# Patient Record
Sex: Female | Born: 1950
Health system: Southern US, Community
[De-identification: ages and names within clinical notes are randomized; demographics above are authoritative.]

## PROBLEM LIST (undated history)

## (undated) DIAGNOSIS — I10 Essential (primary) hypertension: Secondary | ICD-10-CM

## (undated) DIAGNOSIS — E7800 Pure hypercholesterolemia, unspecified: Secondary | ICD-10-CM

---

## 2014-12-06 ENCOUNTER — Emergency Department (HOSPITAL_COMMUNITY)
Admission: EM | Admit: 2014-12-06 | Discharge: 2014-12-07 | Disposition: A | Discharge status: Home / Self Care | Payer: Medicaid - Out of State | Attending: Emergency Medicine | Admitting: Emergency Medicine

## 2014-12-06 ENCOUNTER — Emergency Department (HOSPITAL_COMMUNITY): Payer: Medicaid - Out of State

## 2014-12-06 ENCOUNTER — Encounter (HOSPITAL_COMMUNITY): Payer: Self-pay | Admitting: Emergency Medicine

## 2014-12-06 DIAGNOSIS — Z7982 Long term (current) use of aspirin: Secondary | ICD-10-CM | POA: Diagnosis not present

## 2014-12-06 DIAGNOSIS — R110 Nausea: Secondary | ICD-10-CM | POA: Insufficient documentation

## 2014-12-06 DIAGNOSIS — R51 Headache: Secondary | ICD-10-CM | POA: Insufficient documentation

## 2014-12-06 DIAGNOSIS — Z8639 Personal history of other endocrine, nutritional and metabolic disease: Secondary | ICD-10-CM | POA: Insufficient documentation

## 2014-12-06 DIAGNOSIS — Z79899 Other long term (current) drug therapy: Secondary | ICD-10-CM | POA: Insufficient documentation

## 2014-12-06 DIAGNOSIS — R519 Headache, unspecified: Secondary | ICD-10-CM

## 2014-12-06 DIAGNOSIS — I10 Essential (primary) hypertension: Secondary | ICD-10-CM | POA: Insufficient documentation

## 2014-12-06 HISTORY — DX: Essential (primary) hypertension: I10

## 2014-12-06 HISTORY — DX: Pure hypercholesterolemia, unspecified: E78.00

## 2014-12-06 NOTE — ED Notes (Signed)
Pt arrived to the ED with a complaint of hypertension.  Pt has a hx of same.  Pt states she began having a headache around 1900 hrs.  Pt states that she took her IR hypertension medication around 1800 and the home pressure decreased but was still elevated.  Pt states she retook the medication prior to arrival.  Pt also took aspirin prior to arrival.  Pt is complaining of a headache in the bilateral temples.

## 2014-12-06 NOTE — ED Provider Notes (Signed)
CSN: [id number]     Arrival date & time 12/06/14  2248 History   First MD Initiated Contact with Patient 12/06/14 2320     Chief Complaint  Patient presents with  . Hypertension     (Consider location/radiation/quality/duration/timing/severity/associated sxs/prior Treatment) The history is provided by the patient and medical records. No language interpreter was used.    Tammy Morris is a 64 y.o. female  with a hx of HTN, high cholesterol presents to the Emergency Department complaining of gradual, persistent, progressively worsening temporal headache onset 3 pm today. Pt reports that at 3pm today she had an episode of pallor and gradual onset headache with associated nausea.  Pt's BP when she arrived home was 200/100 (taken by an EMT).  No CP, jaw pain, numbness, weakness, dizziness or vision changes.  Pt took 2 Nifedipine 10mg  tabs SL at 6pm Rx by her doctor.  Pt's BP decreased to 180/96 with a headache at 7/10 at that time.  Headache was not thunderclap, worst of her life or sudden onset.  Pt without syncope or vomiting.  At 11PM she took a third tablet of Nifedipine.  No other symptoms.  No hearing or alleviating factors. No photophobia or phonophobia. Patient reports headache similar one other time previously when she food poisoning.  She denies fever, chills, pain, neck stiffness, chest pain, shortness of breath, abdominal pain, vomiting, diarrhea, weakness, dizziness, syncope, dysuria, hematuria.  Past Medical History  Diagnosis Date  . Hypertension   . Hypercholesteremia    History reviewed. No pertinent past surgical history. History reviewed. No pertinent family history. History  Substance Use Topics  . Smoking status: Never Smoker   . Smokeless tobacco: Never Used  . Alcohol Use: No   OB History    No data available     Review of Systems  Constitutional: Negative for fever, diaphoresis, appetite change, fatigue and unexpected weight change.  HENT: Negative for mouth sores.    Eyes: Negative for visual disturbance.  Respiratory: Negative for cough, chest tightness, shortness of breath and wheezing.   Cardiovascular: Negative for chest pain.  Gastrointestinal: Positive for nausea. Negative for vomiting, abdominal pain, diarrhea and constipation.  Endocrine: Negative for polydipsia, polyphagia and polyuria.  Genitourinary: Negative for dysuria, urgency, frequency and hematuria.  Musculoskeletal: Negative for back pain and neck stiffness.  Skin: Negative for rash.  Allergic/Immunologic: Negative for immunocompromised state.  Neurological: Positive for headaches. Negative for syncope and light-headedness.  Hematological: Does not bruise/bleed easily.  Psychiatric/Behavioral: Negative for sleep disturbance. The patient is not nervous/anxious.       Allergies  Cashew nut oil  Home Medications   Prior to Admission medications   Medication Sig Start Date End Date Taking? Authorizing Provider  aspirin EC 325 MG tablet Take 325 mg by mouth daily.   Yes Historical Provider, MD  Multiple Vitamin (MULTIVITAMIN WITH MINERALS) TABS tablet Take 1 tablet by mouth daily.   Yes Historical Provider, MD  NIFEdipine (PROCARDIA) 10 MG capsule Take 10 mg by mouth 3 (three) times daily as needed (for blood pressure >160).   Yes Historical Provider, MD  olmesartan (BENICAR) 20 MG tablet Take 20 mg by mouth daily.   Yes Historical Provider, MD  triamterene-hydrochlorothiazide (DYAZIDE) 50-25 MG per capsule Take 1 capsule by mouth every morning.   Yes Historical Provider, MD   BP 132/78 mmHg  Pulse 57  Temp(Src) 97.7 F (36.5 C) (Oral)  Resp 14  Ht 5\' 1"  (1.549 m)  Wt 176 lb 5.9 oz (80  kg)  BMI 33.34 kg/m2  SpO2 98%  LMP  (Approximate) Physical Exam  Constitutional: She is oriented to person, place, and time. She appears well-developed and well-nourished. No distress.  HENT:  Head: Normocephalic and atraumatic.  Mouth/Throat: Oropharynx is clear and moist.  Eyes:  Conjunctivae and EOM are normal. Pupils are equal, round, and reactive to light. No scleral icterus.  No horizontal, vertical or rotational nystagmus  Neck: Normal range of motion. Neck supple.  Full active and passive ROM without pain No midline or paraspinal tenderness No nuchal rigidity or meningeal signs  Cardiovascular: Normal rate, regular rhythm, normal heart sounds and intact distal pulses.   No murmur heard. Pulmonary/Chest: Effort normal and breath sounds normal. No respiratory distress. She has no wheezes. She has no rales.  Abdominal: Soft. Bowel sounds are normal. She exhibits no distension. There is no tenderness. There is no rebound and no guarding.  Musculoskeletal: Normal range of motion.  Lymphadenopathy:    She has no cervical adenopathy.  Neurological: She is alert and oriented to person, place, and time. She has normal reflexes. No cranial nerve deficit. She exhibits normal muscle tone. Coordination normal.  Mental Status:  Alert, oriented, thought content appropriate. Speech fluent without evidence of aphasia. Able to follow 2 step commands without difficulty.  Cranial Nerves:  II:  Peripheral visual fields grossly normal, pupils equal, round, reactive to light III,IV, VI: ptosis not present, extra-ocular motions intact bilaterally  V,VII: smile symmetric, facial light touch sensation equal VIII: hearing grossly normal bilaterally  IX,X: gag reflex present  XI: bilateral shoulder shrug equal and strong XII: midline tongue extension  Motor:  5/5 in upper and lower extremities bilaterally including strong and equal grip strength and dorsiflexion/plantar flexion Sensory: Pinprick and light touch normal in all extremities.  Deep Tendon Reflexes: 2+ and symmetric  Cerebellar: normal finger-to-nose with bilateral upper extremities Gait: normal gait and balance CV: distal pulses palpable throughout   Skin: Skin is warm and dry. No rash noted. She is not diaphoretic. No  erythema.  Psychiatric: She has a normal mood and affect. Her behavior is normal. Judgment and thought content normal.  Nursing note and vitals reviewed.   ED Course  Procedures (including critical care time) Labs Review Labs Reviewed  CBC WITH DIFFERENTIAL - Abnormal; Notable for the following:    Neutrophils Relative % 41 (*)    All other components within normal limits  URINALYSIS, ROUTINE W REFLEX MICROSCOPIC  BASIC METABOLIC PANEL  TROPONIN I    Imaging Review Ct Head Wo Contrast  12/07/2014   CLINICAL DATA:  Diffuse headache, onset around 1700 hr tonight. Hypertension.  EXAM: CT HEAD WITHOUT CONTRAST  TECHNIQUE: Contiguous axial images were obtained from the base of the skull through the vertex without intravenous contrast.  COMPARISON:  None.  FINDINGS: Ventricles and sulci appear symmetrical. No mass effect or midline shift. No abnormal extra-axial fluid collections. Gray-white matter junctions are distinct. Basal cisterns are not effaced. No evidence of acute intracranial hemorrhage. No depressed skull fractures. Retention cyst in the right maxillary antrum. Paranasal sinuses are otherwise clear. Mastoid air cells are not opacified.  IMPRESSION: No acute intracranial abnormalities. Retention cyst in the right maxillary antrum.   Electronically Signed   By: Burman Nieves M.D.   On: 12/07/2014 00:26     EKG Interpretation None        Date: 12/07/2014  Rate: 57  Rhythm: sinus bradycardia  QRS Axis: normal  Intervals: normal  ST/T Wave abnormalities:  normal  Conduction Disutrbances:none  Narrative Interpretation: nonischemic ECG  Old EKG Reviewed: none available    MDM   Final diagnoses:  Headache  HTN (hypertension)   Tammy Morris presents with headache and HTN, treated at home by  Procardia sublingual. Patient also with headache. Normal neurologic exam. Head CT and labs will be ordered. Patient was given pain medicine for headache.  1:00 AM Patient remains  alert and oriented. Several episodes of bradycardia noted while in the room to low 50s - upper 40s.  No significant hypotension noted however patient's blood pressure does remain in the 120s to 130s systolic.    Pt discussed with Dr. Read Drivers who will follow pending troponin, BMP, HR and BPs.  Disposition accordingly after her re-evaluation.  Dahlia Client Juno Bozard, PA-C 12/07/14 0102  Hanley Seamen, MD 12/07/14 4782  Hanley Seamen, MD 12/07/14 [id number]

## 2014-12-07 LAB — URINALYSIS, ROUTINE W REFLEX MICROSCOPIC
Bilirubin Urine: NEGATIVE
GLUCOSE, UA: NEGATIVE mg/dL
HGB URINE DIPSTICK: NEGATIVE
Ketones, ur: NEGATIVE mg/dL
Leukocytes, UA: NEGATIVE
Nitrite: NEGATIVE
Protein, ur: NEGATIVE mg/dL
SPECIFIC GRAVITY, URINE: 1.013 (ref 1.005–1.030)
Urobilinogen, UA: 0.2 mg/dL (ref 0.0–1.0)
pH: 7.5 (ref 5.0–8.0)

## 2014-12-07 LAB — BASIC METABOLIC PANEL
Anion gap: 8 (ref 5–15)
BUN: 17 mg/dL (ref 6–23)
CHLORIDE: 103 meq/L (ref 96–112)
CO2: 27 mmol/L (ref 19–32)
Calcium: 9.9 mg/dL (ref 8.4–10.5)
Creatinine, Ser: 0.5 mg/dL (ref 0.50–1.10)
GFR calc Af Amer: >90 mL/min (ref >90)
Glucose, Bld: 100 mg/dL — ABNORMAL HIGH (ref 70–99)
POTASSIUM: 3.5 mmol/L (ref 3.5–5.1)
Sodium: 138 mmol/L (ref 135–145)

## 2014-12-07 LAB — CBC WITH DIFFERENTIAL/PLATELET
BASOS ABS: 0 10*3/uL (ref 0.0–0.1)
BASOS PCT: 1 % (ref 0–1)
EOS ABS: 0.2 10*3/uL (ref 0.0–0.7)
Eosinophils Relative: 3 % (ref 0–5)
HCT: 39.5 % (ref 36.0–46.0)
Hemoglobin: 12.6 g/dL (ref 12.0–15.0)
Lymphocytes Relative: 46 % (ref 12–46)
Lymphs Abs: 3.1 10*3/uL (ref 0.7–4.0)
MCH: 28 pg (ref 26.0–34.0)
MCHC: 31.9 g/dL (ref 30.0–36.0)
MCV: 87.8 fL (ref 78.0–100.0)
MONOS PCT: 9 % (ref 3–12)
Monocytes Absolute: 0.6 10*3/uL (ref 0.1–1.0)
NEUTROS ABS: 2.7 10*3/uL (ref 1.7–7.7)
Neutrophils Relative %: 41 % — ABNORMAL LOW (ref 43–77)
Platelets: 292 10*3/uL (ref 150–400)
RBC: 4.5 MIL/uL (ref 3.87–5.11)
RDW: 13.9 % (ref 11.5–15.5)
WBC: 6.6 10*3/uL (ref 4.0–10.5)

## 2014-12-07 LAB — TROPONIN I: Troponin I: <0.03 ng/mL (ref <0.031)

## 2014-12-07 MED ORDER — NIFEDIPINE 10 MG PO CAPS: 10.0000 mg | ORAL_CAPSULE | Freq: Three times a day (TID) | ORAL | Status: DC | PRN

## 2014-12-07 MED ORDER — HYDROCODONE-ACETAMINOPHEN 5-325 MG PO TABS
2.0000 | ORAL_TABLET | Freq: Once | ORAL | Status: AC
  Administered 2014-12-07: 2 via ORAL
  Filled 2014-12-07: qty 2

## 2017-01-30 ENCOUNTER — Emergency Department (HOSPITAL_COMMUNITY): Payer: Medicaid - Out of State

## 2017-01-30 ENCOUNTER — Encounter (HOSPITAL_COMMUNITY): Payer: Self-pay | Admitting: Emergency Medicine

## 2017-01-30 ENCOUNTER — Emergency Department (HOSPITAL_COMMUNITY)
Admission: EM | Admit: 2017-01-30 | Discharge: 2017-01-30 | Disposition: A | Discharge status: Home / Self Care | Payer: Medicaid - Out of State | Attending: Emergency Medicine | Admitting: Emergency Medicine

## 2017-01-30 DIAGNOSIS — Y929 Unspecified place or not applicable: Secondary | ICD-10-CM | POA: Insufficient documentation

## 2017-01-30 DIAGNOSIS — W19XXXA Unspecified fall, initial encounter: Secondary | ICD-10-CM

## 2017-01-30 DIAGNOSIS — S0083XA Contusion of other part of head, initial encounter: Secondary | ICD-10-CM | POA: Insufficient documentation

## 2017-01-30 DIAGNOSIS — Z7982 Long term (current) use of aspirin: Secondary | ICD-10-CM | POA: Insufficient documentation

## 2017-01-30 DIAGNOSIS — Y999 Unspecified external cause status: Secondary | ICD-10-CM | POA: Insufficient documentation

## 2017-01-30 DIAGNOSIS — S40811A Abrasion of right upper arm, initial encounter: Secondary | ICD-10-CM | POA: Insufficient documentation

## 2017-01-30 DIAGNOSIS — W01198A Fall on same level from slipping, tripping and stumbling with subsequent striking against other object, initial encounter: Secondary | ICD-10-CM | POA: Insufficient documentation

## 2017-01-30 DIAGNOSIS — I10 Essential (primary) hypertension: Secondary | ICD-10-CM | POA: Insufficient documentation

## 2017-01-30 DIAGNOSIS — Y939 Activity, unspecified: Secondary | ICD-10-CM | POA: Insufficient documentation

## 2017-01-30 DIAGNOSIS — M542 Cervicalgia: Secondary | ICD-10-CM | POA: Insufficient documentation

## 2017-01-30 MED ORDER — TETANUS-DIPHTH-ACELL PERTUSSIS 5-2.5-18.5 LF-MCG/0.5 IM SUSP: 0.5000 mL | Freq: Once | INTRAMUSCULAR | Status: DC

## 2017-01-30 MED ORDER — BACITRACIN ZINC 500 UNIT/GM EX OINT
TOPICAL_OINTMENT | Freq: Two times a day (BID) | CUTANEOUS | Status: DC
  Filled 2017-01-30: qty 0.9

## 2017-01-30 MED ORDER — ACETAMINOPHEN 500 MG PO TABS
1000.0000 mg | ORAL_TABLET | Freq: Once | ORAL | Status: AC
  Administered 2017-01-30: 1000 mg via ORAL
  Filled 2017-01-30: qty 2

## 2017-01-30 MED ORDER — ACETAMINOPHEN 325 MG PO TABS
650.0000 mg | ORAL_TABLET | Freq: Once | ORAL | Status: AC
  Administered 2017-01-30: 650 mg via ORAL
  Filled 2017-01-30: qty 2

## 2017-01-30 MED ORDER — BACITRACIN ZINC 500 UNIT/GM EX OINT
TOPICAL_OINTMENT | CUTANEOUS | Status: AC
  Administered 2017-01-30: 1
  Filled 2017-01-30: qty 0.9

## 2017-01-30 NOTE — Discharge Instructions (Signed)
Apply ice to painful swollen areas 4 times daily for 30 minutes at a time. Wash the wound on your nose daily with soap and water and place a thin layer of bacitracin ointment over the wound. Signs of infection include redness around the wound, more pain, drainage from the wound or fever. Return or see an urgent care center if you think he might be developing an infection.. Your blood pressure is elevated tonight at 161/78. It should be rechecked within the next one or 2 weeks. Take Tylenol as directed for pain

## 2017-01-30 NOTE — ED Provider Notes (Signed)
[facility]-EMERGENCY DEPT Provider Note   CSN: [id number] Arrival date & time: 01/30/17  1713  Patient speaks no English. She was offered a professional interpreter which she declined. Her adult child acted as interpreter   History   Education officer, museumChief Complaint Chief Complaint  Patient presents with  . Fall  . Facial Injury  . Hypertension    HPI Tammy Morris is a 66 y.o. female.HPI Patient tripped and fell on the sidewalk 5 PM today striking her face. No other injury. No loss consciousness no neck pain. She displays a facial pain and mild headache since the event. No other associated symptoms. No treatment prior to coming here. She's been Tammy GreenspanAmer Morris since event. She was feeling well prior to the event. Past Medical History:  Diagnosis Date  . Hypercholesteremia   . Hypertension     There are no active problems to display for this patient.   History reviewed. No pertinent surgical history.  OB History    No data available       Home Medications    Prior to Admission medications   Medication Sig Start Date End Date Taking? Authorizing Provider  Amlodipine Besylate-Valsartan (EXFORGE PO) Take 1 tablet by mouth daily.   Yes Historical Provider, MD  aspirin EC 325 MG tablet Take 325 mg by mouth daily.   Yes Historical Provider, MD  ATORVASTATIN CALCIUM PO Take 1 tablet by mouth every evening.   Yes Historical Provider, MD  NIFEdipine (PROCARDIA) 10 MG capsule Take 1 capsule (10 mg total) by mouth 3 (three) times daily as needed (for blood pressure >160). Patient not taking: Reported on 01/30/2017 12/07/14   Paula LibraJohn Molpus, MD  olmesartan (BENICAR) 20 MG tablet Take 20 mg by mouth daily.    Historical Provider, MD    Family History No family history on file.  Social History Social History  Substance Use Topics  . Smoking status: Never Smoker  . Smokeless tobacco: Never Used  . Alcohol use No     Allergies   Cashew nut oil   Review of Systems Review of Systems  Constitutional:  Negative.   HENT: Positive for facial swelling.        Facial pain  Respiratory: Negative.   Cardiovascular: Negative.   Gastrointestinal: Negative.   Musculoskeletal: Negative.   Skin: Negative.   Neurological: Negative.   Psychiatric/Behavioral: Negative.   All other systems reviewed and are negative.    Physical Exam Updated Vital Signs BP 161/78   Pulse 68   Temp 98.6 F (37 C) (Oral)   Resp 16   Ht 5\' 1"  (1.549 m)   Wt 176 lb (79.8 kg)   SpO2 99%   BMI 33.25 kg/m   Physical Exam  Constitutional: She is oriented to person, place, and time. She appears well-developed and well-nourished. No distress.  HENT:  Tip of nose is swollen and ecchymotic. No septal hematoma. There is an abrasion over the nose. Left sided infraorbital ecchymosis.  Eyes: Conjunctivae and EOM are normal. Pupils are equal, round, and reactive to light.  No diplopia  Neck: Neck supple. No tracheal deviation present. No thyromegaly present.  Cervical spine nontender.  Cardiovascular: Normal rate and regular rhythm.   No murmur heard. Pulmonary/Chest: Effort normal and breath sounds normal.  Abdominal: Soft. Bowel sounds are normal. She exhibits no distension. There is no tenderness.  Musculoskeletal: Normal range of motion. She exhibits no edema or tenderness.    right upper extremity 2 cm abrasion over right thenar eminence and full  range of motion. No bony tenderness. Good capillary refill. All extremities no contusion abrasion or tenderness neurovascular intact. Pelvis stable nontender. Entire spine nontender.   Neurological: She is alert and oriented to person, place, and time. No cranial nerve deficit. Coordination normal.  Gait normal motor strength 5 over 5 overall  Skin: Skin is warm and dry. No rash noted.  Psychiatric: She has a normal mood and affect.  Nursing note and vitals reviewed.    Results for orders placed or performed during the hospital encounter of 12/06/14  CBC with  Differential  Result Value Ref Range   WBC 6.6 4.0 - 10.5 K/uL   RBC 4.50 3.87 - 5.11 MIL/uL   Hemoglobin 12.6 12.0 - 15.0 g/dL   HCT 16.1 09.6 - 04.5 %   MCV 87.8 78.0 - 100.0 fL   MCH 28.0 26.0 - 34.0 pg   MCHC 31.9 30.0 - 36.0 g/dL   RDW 40.9 81.1 - 91.4 %   Platelets 292 150 - 400 K/uL   Neutrophils Relative % 41 (L) 43 - 77 %   Neutro Abs 2.7 1.7 - 7.7 K/uL   Lymphocytes Relative 46 12 - 46 %   Lymphs Abs 3.1 0.7 - 4.0 K/uL   Monocytes Relative 9 3 - 12 %   Monocytes Absolute 0.6 0.1 - 1.0 K/uL   Eosinophils Relative 3 0 - 5 %   Eosinophils Absolute 0.2 0.0 - 0.7 K/uL   Basophils Relative 1 0 - 1 %   Basophils Absolute 0.0 0.0 - 0.1 K/uL  Basic metabolic panel  Result Value Ref Range   Sodium 138 135 - 145 mmol/L   Potassium 3.5 3.5 - 5.1 mmol/L   Chloride 103 96 - 112 mEq/L   CO2 27 19 - 32 mmol/L   Glucose, Bld 100 (H) 70 - 99 mg/dL   BUN 17 6 - 23 mg/dL   Creatinine, Ser 7.82 0.50 - 1.10 mg/dL   Calcium 9.9 8.4 - 95.6 mg/dL   GFR calc non Af Tammy >90 >90 mL/min   GFR calc Af Tammy >90 >90 mL/min   Anion gap 8 5 - 15  Troponin I  Result Value Ref Range   Troponin I <0.03 <0.031 ng/mL  Urinalysis, Routine w reflex microscopic  Result Value Ref Range   Color, Urine YELLOW YELLOW   APPearance CLEAR CLEAR   Specific Gravity, Urine 1.013 1.005 - 1.030   pH 7.5 5.0 - 8.0   Glucose, UA NEGATIVE NEGATIVE mg/dL   Hgb urine dipstick NEGATIVE NEGATIVE   Bilirubin Urine NEGATIVE NEGATIVE   Ketones, ur NEGATIVE NEGATIVE mg/dL   Protein, ur NEGATIVE NEGATIVE mg/dL   Urobilinogen, UA 0.2 0.0 - 1.0 mg/dL   Nitrite NEGATIVE NEGATIVE   Leukocytes, UA NEGATIVE NEGATIVE   Ct Cervical Spine Wo Contrast  Result Date: 01/30/2017 CLINICAL DATA:  Trip and fall on sidewalk with facial and cervical neck pain. EXAM: CT MAXILLOFACIAL WITHOUT CONTRAST CT CERVICAL SPINE WITHOUT CONTRAST TECHNIQUE: Multidetector CT imaging of the maxillofacial structures was performed. Multiplanar CT image  reconstructions were also generated. A small metallic BB was placed on the right temple in order to reliably differentiate right from left. Multidetector CT imaging of the cervical spine was performed without intravenous contrast. Multiplanar CT image reconstructions were also generated. COMPARISON:  None. FINDINGS: CT MAXILLOFACIAL FINDINGS Osseous: No acute facial bone fracture. There is anterior nasal edema without evidence of nasal bone fracture. Nasal septum is midline. Mandibles are intact, temporomandibular joints remain  congruent. Zygomatic arches are intact. Orbits: Negative. No traumatic or inflammatory finding. Sinuses: Mucous retention cyst in the right maxillary sinus. Paranasal sinuses are otherwise clear. No fluid levels. Soft tissues: Nasal edema and probable hematoma anteriorly. The left frontal scalp edema. No radiopaque foreign body. Limited intracranial: No significant or unexpected finding. CT CERVICAL FINDINGS Alignment: Reversal of normal lordosis. Trace anterolisthesis of C7 on T1 appears degenerative. No acute traumatic subluxation, jumped or perched facets. Skull base and vertebrae: No acute fracture. No primary bone lesion or focal pathologic process. Scattered lucencies throughout the vertebra suggestive of osteopenia. Soft tissues and spinal canal: No prevertebral fluid or swelling. No visible canal hematoma. Disc levels: Disc space narrowing and endplate spurring is most prominent at C5-C6. Multifocal facet arthropathy, most prominent at C2-C3 on the right. Upper chest: No acute abnormality. Other: None. IMPRESSION: 1. Nasal swelling and probable hematoma. No evidence of nasal bone fracture. No acute facial bone fracture is seen. 2. Degenerative change in the cervical spine without acute fracture or subluxation. Electronically Signed   By: Rubye Oaks M.D.   On: 01/30/2017 19:18   Ct Maxillofacial Wo Contrast  Result Date: 01/30/2017 CLINICAL DATA:  Trip and fall on sidewalk  with facial and cervical neck pain. EXAM: CT MAXILLOFACIAL WITHOUT CONTRAST CT CERVICAL SPINE WITHOUT CONTRAST TECHNIQUE: Multidetector CT imaging of the maxillofacial structures was performed. Multiplanar CT image reconstructions were also generated. A small metallic BB was placed on the right temple in order to reliably differentiate right from left. Multidetector CT imaging of the cervical spine was performed without intravenous contrast. Multiplanar CT image reconstructions were also generated. COMPARISON:  None. FINDINGS: CT MAXILLOFACIAL FINDINGS Osseous: No acute facial bone fracture. There is anterior nasal edema without evidence of nasal bone fracture. Nasal septum is midline. Mandibles are intact, temporomandibular joints remain congruent. Zygomatic arches are intact. Orbits: Negative. No traumatic or inflammatory finding. Sinuses: Mucous retention cyst in the right maxillary sinus. Paranasal sinuses are otherwise clear. No fluid levels. Soft tissues: Nasal edema and probable hematoma anteriorly. The left frontal scalp edema. No radiopaque foreign body. Limited intracranial: No significant or unexpected finding. CT CERVICAL FINDINGS Alignment: Reversal of normal lordosis. Trace anterolisthesis of C7 on T1 appears degenerative. No acute traumatic subluxation, jumped or perched facets. Skull base and vertebrae: No acute fracture. No primary bone lesion or focal pathologic process. Scattered lucencies throughout the vertebra suggestive of osteopenia. Soft tissues and spinal canal: No prevertebral fluid or swelling. No visible canal hematoma. Disc levels: Disc space narrowing and endplate spurring is most prominent at C5-C6. Multifocal facet arthropathy, most prominent at C2-C3 on the right. Upper chest: No acute abnormality. Other: None. IMPRESSION: 1. Nasal swelling and probable hematoma. No evidence of nasal bone fracture. No acute facial bone fracture is seen. 2. Degenerative change in the cervical spine  without acute fracture or subluxation. Electronically Signed   By: Rubye Oaks M.D.   On: 01/30/2017 19:18   ED Treatments / Results  Labs (all labs ordered are listed, but only abnormal results are displayed) Labs Reviewed - No data to display  EKG  EKG Interpretation None       Radiology Ct Cervical Spine Wo Contrast  Result Date: 01/30/2017 CLINICAL DATA:  Trip and fall on sidewalk with facial and cervical neck pain. EXAM: CT MAXILLOFACIAL WITHOUT CONTRAST CT CERVICAL SPINE WITHOUT CONTRAST TECHNIQUE: Multidetector CT imaging of the maxillofacial structures was performed. Multiplanar CT image reconstructions were also generated. A small metallic BB was placed on  the right temple in order to reliably differentiate right from left. Multidetector CT imaging of the cervical spine was performed without intravenous contrast. Multiplanar CT image reconstructions were also generated. COMPARISON:  None. FINDINGS: CT MAXILLOFACIAL FINDINGS Osseous: No acute facial bone fracture. There is anterior nasal edema without evidence of nasal bone fracture. Nasal septum is midline. Mandibles are intact, temporomandibular joints remain congruent. Zygomatic arches are intact. Orbits: Negative. No traumatic or inflammatory finding. Sinuses: Mucous retention cyst in the right maxillary sinus. Paranasal sinuses are otherwise clear. No fluid levels. Soft tissues: Nasal edema and probable hematoma anteriorly. The left frontal scalp edema. No radiopaque foreign body. Limited intracranial: No significant or unexpected finding. CT CERVICAL FINDINGS Alignment: Reversal of normal lordosis. Trace anterolisthesis of C7 on T1 appears degenerative. No acute traumatic subluxation, jumped or perched facets. Skull base and vertebrae: No acute fracture. No primary bone lesion or focal pathologic process. Scattered lucencies throughout the vertebra suggestive of osteopenia. Soft tissues and spinal canal: No prevertebral fluid or  swelling. No visible canal hematoma. Disc levels: Disc space narrowing and endplate spurring is most prominent at C5-C6. Multifocal facet arthropathy, most prominent at C2-C3 on the right. Upper chest: No acute abnormality. Other: None. IMPRESSION: 1. Nasal swelling and probable hematoma. No evidence of nasal bone fracture. No acute facial bone fracture is seen. 2. Degenerative change in the cervical spine without acute fracture or subluxation. Electronically Signed   By: Rubye Oaks M.D.   On: 01/30/2017 19:18   Ct Maxillofacial Wo Contrast  Result Date: 01/30/2017 CLINICAL DATA:  Trip and fall on sidewalk with facial and cervical neck pain. EXAM: CT MAXILLOFACIAL WITHOUT CONTRAST CT CERVICAL SPINE WITHOUT CONTRAST TECHNIQUE: Multidetector CT imaging of the maxillofacial structures was performed. Multiplanar CT image reconstructions were also generated. A small metallic BB was placed on the right temple in order to reliably differentiate right from left. Multidetector CT imaging of the cervical spine was performed without intravenous contrast. Multiplanar CT image reconstructions were also generated. COMPARISON:  None. FINDINGS: CT MAXILLOFACIAL FINDINGS Osseous: No acute facial bone fracture. There is anterior nasal edema without evidence of nasal bone fracture. Nasal septum is midline. Mandibles are intact, temporomandibular joints remain congruent. Zygomatic arches are intact. Orbits: Negative. No traumatic or inflammatory finding. Sinuses: Mucous retention cyst in the right maxillary sinus. Paranasal sinuses are otherwise clear. No fluid levels. Soft tissues: Nasal edema and probable hematoma anteriorly. The left frontal scalp edema. No radiopaque foreign body. Limited intracranial: No significant or unexpected finding. CT CERVICAL FINDINGS Alignment: Reversal of normal lordosis. Trace anterolisthesis of C7 on T1 appears degenerative. No acute traumatic subluxation, jumped or perched facets. Skull base  and vertebrae: No acute fracture. No primary bone lesion or focal pathologic process. Scattered lucencies throughout the vertebra suggestive of osteopenia. Soft tissues and spinal canal: No prevertebral fluid or swelling. No visible canal hematoma. Disc levels: Disc space narrowing and endplate spurring is most prominent at C5-C6. Multifocal facet arthropathy, most prominent at C2-C3 on the right. Upper chest: No acute abnormality. Other: None. IMPRESSION: 1. Nasal swelling and probable hematoma. No evidence of nasal bone fracture. No acute facial bone fracture is seen. 2. Degenerative change in the cervical spine without acute fracture or subluxation. Electronically Signed   By: Rubye Oaks M.D.   On: 01/30/2017 19:18    Procedures Procedures (including critical care time)  Medications Ordered in ED Medications  Tdap (BOOSTRIX) injection 0.5 mL (not administered)  acetaminophen (TYLENOL) tablet 650 mg (not administered)  bacitracin ointment (not administered)  acetaminophen (TYLENOL) tablet 1,000 mg (1,000 mg Oral Given 01/30/17 1755)  bacitracin 500 UNIT/GM ointment (1 application  Given 01/30/17 2122)     Initial Impression / Assessment and Plan / ED Course  I have reviewed the triage vital signs and the nursing notes.  Pertinent labs & imaging results that were available during my care of the patient were reviewed by me and considered in my medical decision making (see chart for details).     Plan Tylenol local wound care. Blood pressure recheck within the next one or 2 weeks.  Final Clinical Impressions(s) / ED Diagnoses  Dx #1 fall #2 facial contusion #3 elevated blood pressure blood pressure Final diagnoses:  Contusion of face, initial encounter  Fall, initial encounter    New Prescriptions New Prescriptions   No medications on file     Doug Sou, MD 01/30/17 2137

## 2017-01-30 NOTE — ED Notes (Signed)
ED Provider at bedside. 

## 2017-01-30 NOTE — ED Triage Notes (Signed)
Pt complaint of facial pain 7/10 post tripping on sidewalk. No LOC. Pt hypertensive and complaint with medications.

## 2017-01-30 NOTE — ED Provider Notes (Signed)
MSE was initiated and I personally evaluated the patient and placed orders (if any) at  6:04 PM on January 30, 2017.  The patient appears stable so that the remainder of the MSE may be completed by another provider.  Patient tripped and fell one hour ago striking her face. No loss of consciousness. Her son reports that her nose is markedly swollen since the event. Patient is alert ambulatory Glasgow Coma Score 15.   Doug SouSam Karrina Lye, MD 01/30/17 [phone]

## 2017-12-22 ENCOUNTER — Encounter (HOSPITAL_COMMUNITY): Payer: Self-pay | Admitting: Emergency Medicine

## 2017-12-22 ENCOUNTER — Emergency Department (HOSPITAL_COMMUNITY)
Admission: EM | Admit: 2017-12-22 | Discharge: 2017-12-22 | Disposition: A | Discharge status: Home / Self Care | Payer: Medicaid - Out of State | Attending: Emergency Medicine | Admitting: Emergency Medicine

## 2017-12-22 ENCOUNTER — Emergency Department (HOSPITAL_COMMUNITY): Payer: Medicaid - Out of State

## 2017-12-22 DIAGNOSIS — Z7982 Long term (current) use of aspirin: Secondary | ICD-10-CM | POA: Insufficient documentation

## 2017-12-22 DIAGNOSIS — Z79899 Other long term (current) drug therapy: Secondary | ICD-10-CM | POA: Insufficient documentation

## 2017-12-22 DIAGNOSIS — R519 Headache, unspecified: Secondary | ICD-10-CM

## 2017-12-22 DIAGNOSIS — I160 Hypertensive urgency: Secondary | ICD-10-CM | POA: Diagnosis not present

## 2017-12-22 DIAGNOSIS — R51 Headache: Secondary | ICD-10-CM

## 2017-12-22 LAB — CBC WITH DIFFERENTIAL/PLATELET
BASOS ABS: 0 10*3/uL (ref 0.0–0.1)
BASOS PCT: 0 %
Eosinophils Absolute: 0.1 10*3/uL (ref 0.0–0.7)
Eosinophils Relative: 1 %
HCT: 40 % (ref 36.0–46.0)
HEMOGLOBIN: 13.3 g/dL (ref 12.0–15.0)
Lymphocytes Relative: 34 %
Lymphs Abs: 2.4 10*3/uL (ref 0.7–4.0)
MCH: 29.3 pg (ref 26.0–34.0)
MCHC: 33.3 g/dL (ref 30.0–36.0)
MCV: 88.1 fL (ref 78.0–100.0)
Monocytes Absolute: 0.5 10*3/uL (ref 0.1–1.0)
Monocytes Relative: 7 %
NEUTROS PCT: 58 %
Neutro Abs: 3.9 10*3/uL (ref 1.7–7.7)
PLATELETS: 324 10*3/uL (ref 150–400)
RBC: 4.54 MIL/uL (ref 3.87–5.11)
RDW: 13 % (ref 11.5–15.5)
WBC: 6.9 10*3/uL (ref 4.0–10.5)

## 2017-12-22 LAB — BASIC METABOLIC PANEL
Anion gap: 7 (ref 5–15)
BUN: 16 mg/dL (ref 6–20)
CHLORIDE: 104 mmol/L (ref 101–111)
CO2: 28 mmol/L (ref 22–32)
Calcium: 9.7 mg/dL (ref 8.9–10.3)
Creatinine, Ser: 0.54 mg/dL (ref 0.44–1.00)
GFR calc Af Amer: >60 mL/min (ref >60)
Glucose, Bld: 100 mg/dL — ABNORMAL HIGH (ref 65–99)
Potassium: 3.5 mmol/L (ref 3.5–5.1)
Sodium: 139 mmol/L (ref 135–145)

## 2017-12-22 LAB — SEDIMENTATION RATE: SED RATE: 16 mm/h (ref 0–22)

## 2017-12-22 MED ORDER — HYDRALAZINE HCL 10 MG PO TABS: 10.0000 mg | ORAL_TABLET | ORAL | 0 refills | Status: AC | PRN

## 2017-12-22 MED ORDER — HYDRALAZINE HCL 10 MG PO TABS: 10.0000 mg | ORAL_TABLET | Freq: Three times a day (TID) | ORAL | 0 refills | Status: DC

## 2017-12-22 MED FILL — hydrALAZINE HCL 10 MG TABS: 10 | 90 days supply | Qty: 90 | Fill #0

## 2017-12-22 NOTE — ED Triage Notes (Signed)
Patient brought in by son who translates for patient stating that her BP been running high and past 2 days been waking up with headaches that is posterior. Patient PCP is in [place] where patient lives, so hasnt made aware of HTN and headaches. Patient did take her medications after getting up and BP will start to come down. Denies nausea/vomiting or dizziness or blurred vision.

## 2017-12-22 NOTE — ED Notes (Signed)
Patient transported to CT 

## 2017-12-22 NOTE — ED Provider Notes (Signed)
[facility] DEPT Provider Note   CSN: [id number] Arrival date & time: 12/22/17  1105     History   Chief Complaint Chief Complaint  Patient presents with  . Headache  . Hypertension    HPI Tammy Morris is a 67 y.o. female.  HPI  67 year old female presents with hypertension.  History is taken from the family as the patient does not speak [place].  She is from [place] but typically spends about 6 months out of the year here.  She has a primary care doctor in [place] and is working to get one in [place] but the first appointment has not been set up yet.  Has a long-standing history of hypertension and is currently on amlodipine/besylate/valsartan.  Also takes captopril as needed when blood pressure is over 180 typically her blood pressure is around 145/80.  However, over the last 3 days it has been higher, up to 210/110.  She tends to get a headache whenever the blood pressure goes up.  This is a recurrent issue for her.  Currently there is essentially no headache or a mild headache.  She has been getting right eye pain just before her blood pressure spikes but currently denies any eye discomfort or pain.  No blurry vision.  No dizziness, weakness, numbness, or chest pain.  Past Medical History:  Diagnosis Date  . Hypercholesteremia   . Hypertension     There are no active problems to display for this patient.   History reviewed. No pertinent surgical history.  OB History    No data available       Home Medications    Prior to Admission medications   Medication Sig Start Date End Date Taking? Authorizing Provider  acetaminophen (TYLENOL) 500 MG tablet Take 1,000 mg by mouth daily as needed (NECK PAIN).   Yes [provider]  Amlodipine-Valsartan-HCTZ (EXFORGE HCT) 5-160-25 MG TABS Take 1 tablet by mouth daily.   Yes [provider]  aspirin EC 81 MG tablet Take 81 mg by mouth daily.   Yes [provider]    atorvastatin (LIPITOR) 20 MG tablet Take 1 tablet by mouth every evening.   Yes [provider]  Tetrahydrozoline HCl (VISINE OP) Apply 1 drop to eye daily as needed (DRY EYE).   Yes [provider]  hydrALAZINE (APRESOLINE) 10 MG tablet Take 1 tablet (10 mg total) by mouth 3 (three) times daily. 12/22/17   Sherwood Gambler, MD    Family History No family history on file.  Social History Social History   Tobacco Use  . Smoking status: Never Smoker  . Smokeless tobacco: Never Used  Substance Use Topics  . Alcohol use: No  . Drug use: No     Allergies   Cashew nut oil   Review of Systems Review of Systems  Constitutional: Negative for fever.  Eyes: Positive for pain. Negative for visual disturbance.  Respiratory: Negative for shortness of breath.   Cardiovascular: Negative for chest pain.  Gastrointestinal: Negative for abdominal pain and vomiting.  Neurological: Positive for headaches. Negative for dizziness, weakness and numbness.  All other systems reviewed and are negative.    Physical Exam Updated Vital Signs BP (!) 156/79   Pulse (!) 56   Temp 97.7 F (36.5 C) (Oral)   Resp 15   SpO2 99%   Physical Exam  Constitutional: She is oriented to person, place, and time. She appears well-developed and well-nourished.  Non-toxic appearance. She does not appear ill.  No distress.  HENT:  Head: Normocephalic and atraumatic.  Right Ear: External ear normal.  Left Ear: External ear normal.  Nose: Nose normal.  No tenderness over temples  Eyes: EOM are normal. Pupils are equal, round, and reactive to light. Right eye exhibits no discharge. Left eye exhibits no discharge.  Neck: Neck supple.  Cardiovascular: Normal rate, regular rhythm and normal heart sounds.  Pulmonary/Chest: Effort normal and breath sounds normal.  Abdominal: Soft. There is no tenderness.  Neurological: She is alert and oriented to person, place, and time.  CN 3-12 grossly intact.  5/5 strength in all 4 extremities. Grossly normal sensation. Normal finger to nose.   Skin: Skin is warm and dry.  Nursing note and vitals reviewed.    ED Treatments / Results  Labs (all labs ordered are listed, but only abnormal results are displayed) Labs Reviewed  BASIC METABOLIC PANEL - Abnormal; Notable for the following components:      Result Value   Glucose, Bld 100 (*)    All other components within normal limits  CBC WITH DIFFERENTIAL/PLATELET  SEDIMENTATION RATE    EKG  EKG Interpretation None       Radiology Ct Head Wo Contrast  Result Date: 12/22/2017 CLINICAL DATA:  Hypertension for the past 2 days. Occipital headache. EXAM: CT HEAD WITHOUT CONTRAST TECHNIQUE: Contiguous axial images were obtained from the base of the skull through the vertex without intravenous contrast. COMPARISON:  12/07/2014 FINDINGS: Brain: No evidence of acute infarction, hemorrhage, hydrocephalus, extra-axial collection or mass lesion/mass effect. Vascular: No hyperdense vessel or unexpected calcification. Skull: Normal. Negative for fracture or focal lesion. Sinuses/Orbits: Small mucous retention cyst noted of the right maxillary sinus. Right lens replacement. Intact orbits and globes. Other: None. IMPRESSION: Normal head CT. Small mucous retention cyst of the right maxillary sinus. Electronically Signed   By: Ashley Royalty M.D.   On: 12/22/2017 13:45    Procedures Procedures (including critical care time)  Medications Ordered in ED Medications - No data to display   Initial Impression / Assessment and Plan / ED Course  I have reviewed the triage vital signs and the nursing notes.  Pertinent labs & imaging results that were available during my care of the patient were reviewed by me and considered in my medical decision making (see chart for details).     Patient's hypertension seems to be fluctuating but currently is steadily improving.  I will add hydralazine to her medication list  and remove the captopril as this seems to be doubling effect given she is also on an ARB.  Headache seems to come and go with the blood pressure but the CT head is negative and her neuro exam is unremarkable.  She is well-appearing.  She has minimal to no headache now.  Her eyes seems to hurt only when the blood pressure goes up and it is not hurting now and there is no blurry vision.  ESR negative, temporal is unlikely.  Otherwise I think glaucoma is highly unlikely given no eye pain now.  Plan to discharge home with follow-up with PCP either here or in [place], whichever is faster.  Return precautions.  Final Clinical Impressions(s) / ED Diagnoses   Final diagnoses:  Intermittent headache  Hypertensive urgency    ED Discharge Orders        Ordered    hydrALAZINE (APRESOLINE) 10 MG tablet  3 times daily     12/22/17 1615       Sherwood Gambler, MD  12/22/17 1620  

## 2018-10-26 IMAGING — CT CT HEAD W/O CM
3 series · 14 of 47 positions shown, 16 images · non-contrast
Comparison: 12/07/2014

CLINICAL DATA: Hypertension for the past 2 days. Occipital
headache.

EXAM:
CT HEAD WITHOUT CONTRAST
TECHNIQUE: Contiguous axial images were obtained from the base of the skull
through the vertex without intravenous contrast.

[Series 2: head wo · axial · 0.44mm/px · z∈[-149,-19]mm · 8 of 32 slices shown, 10 images]
[im 3/32  brain]
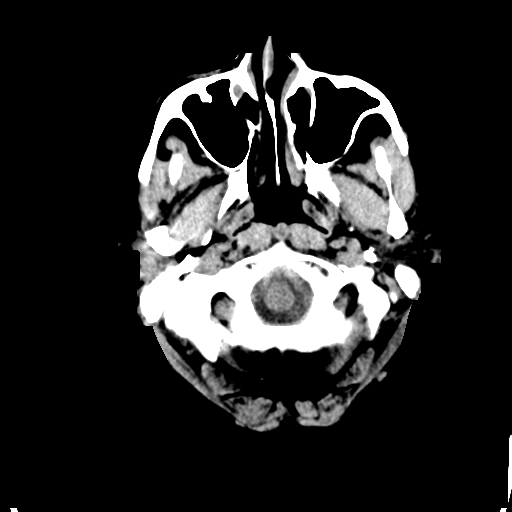
[im 3/32  bone]
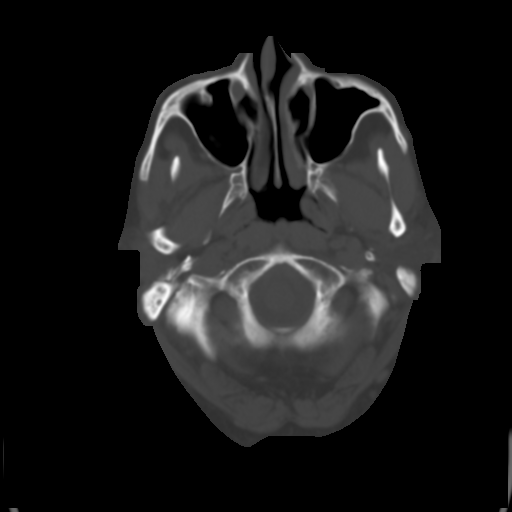
[im 7/32  brain]
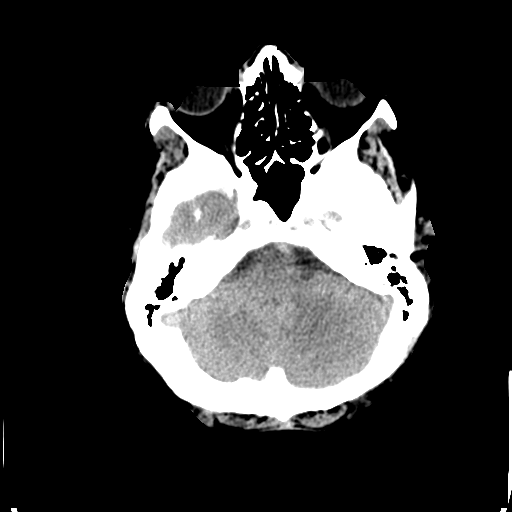
[im 10/32  brain]
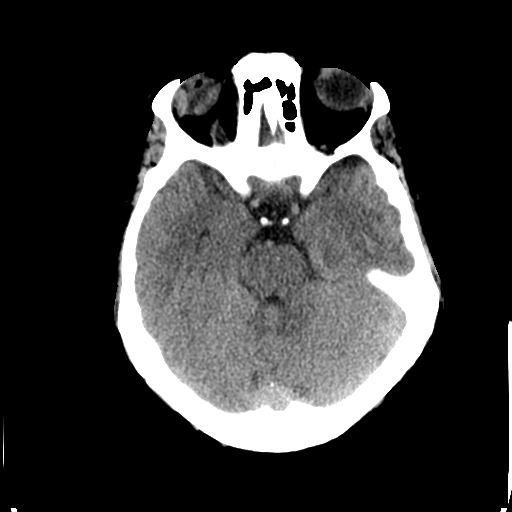
[im 14/32  brain]
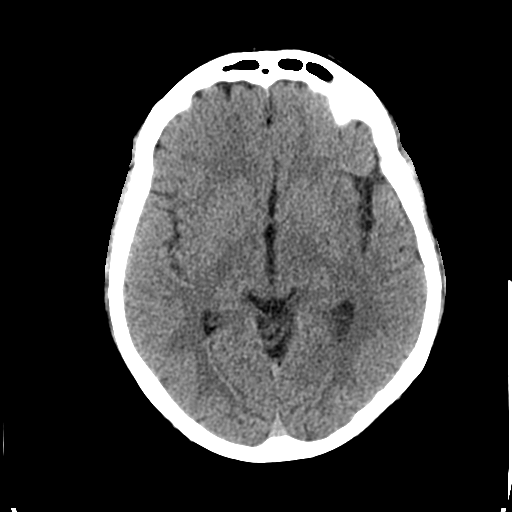
[im 18/32  brain]
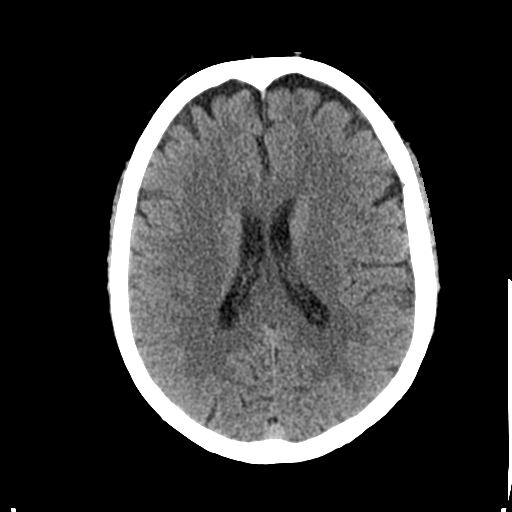
[im 18/32  bone]
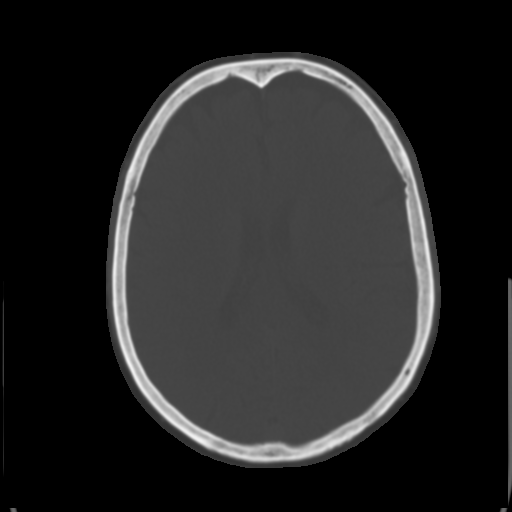
[im 22/32  brain]
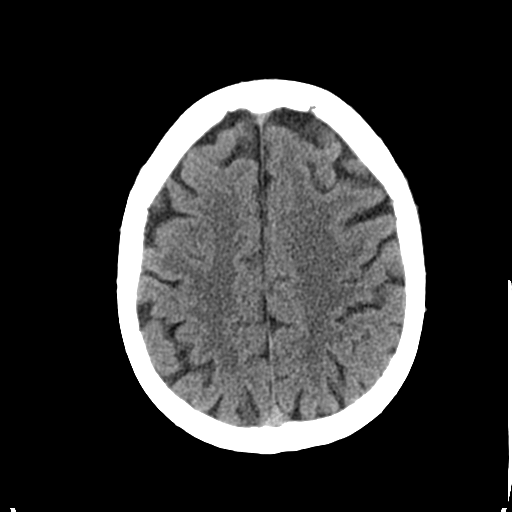
[im 25/32  brain]
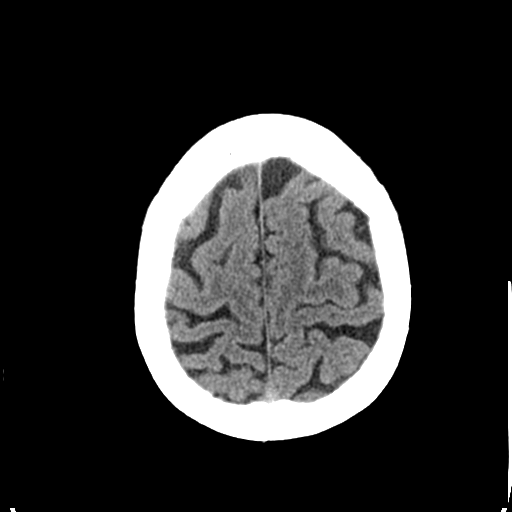
[im 29/32  brain]
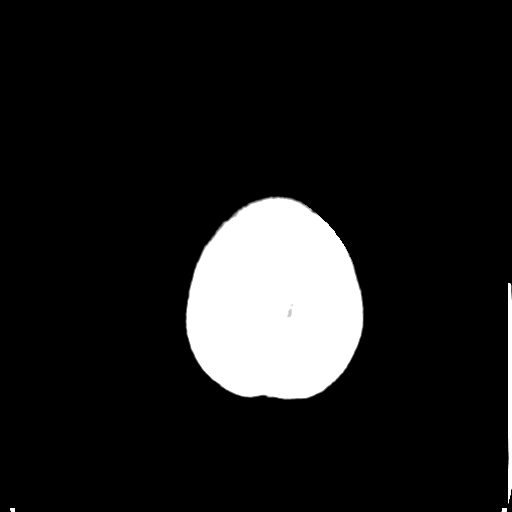

[Series 5: coronal soft tissue · coronal · 0.30mm/px · 3 of 75 slices shown]
[im 25/75  brain]
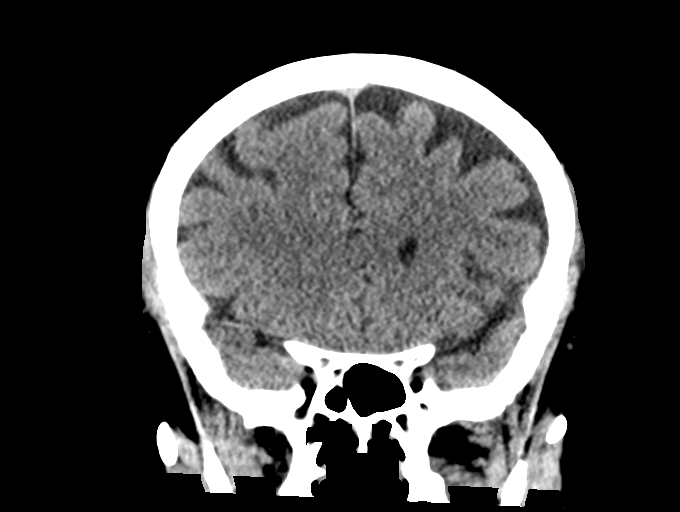
[im 33/75  brain]
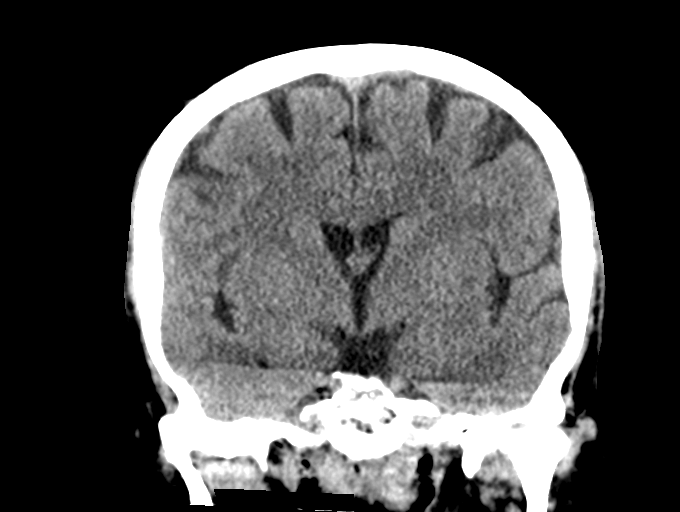
[im 42/75  brain]
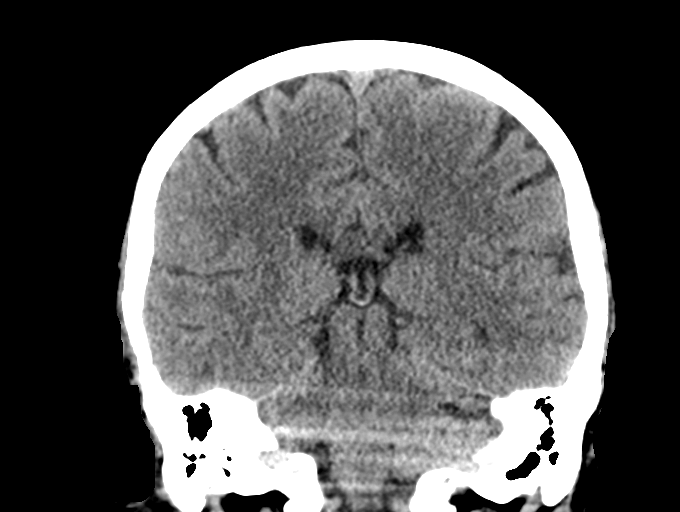

[Series 6: sagittal soft tissue · sagittal · 0.30mm/px · 3 of 60 slices shown]
[im 20/60  brain]
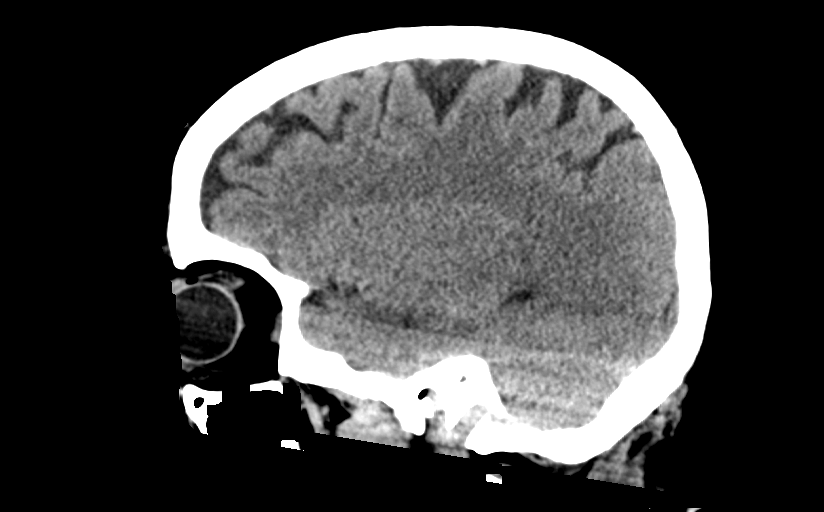
[im 30/60  brain]
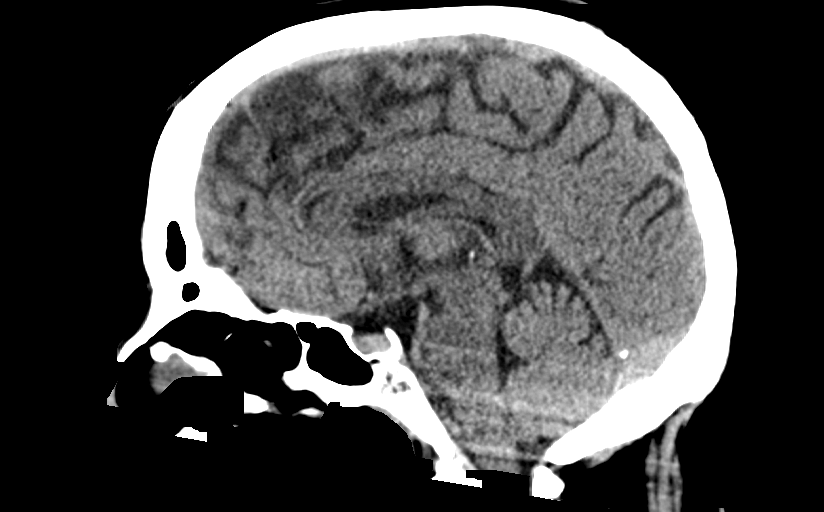
[im 40/60  brain]
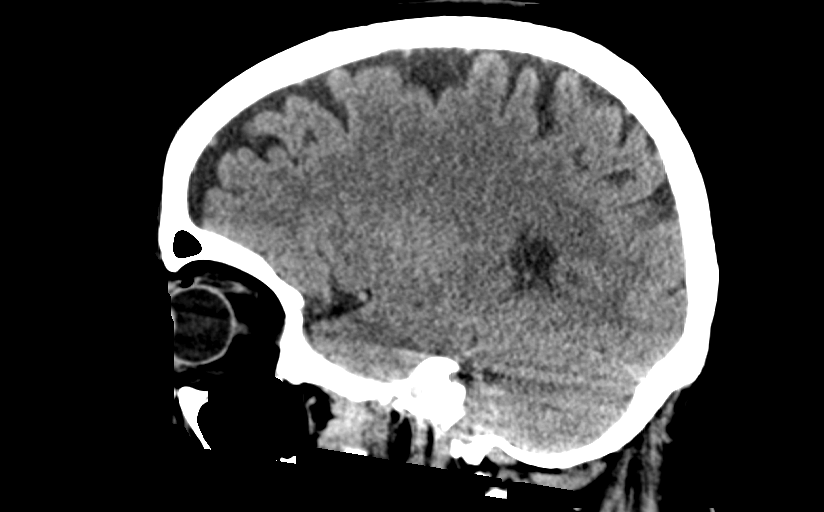

[14 of 47 positions shown; findings below may reference images not displayed]

FINDINGS: Brain: No evidence of acute infarction, hemorrhage, hydrocephalus,
extra-axial collection or mass lesion/mass effect.

Vascular: No hyperdense vessel or unexpected calcification.

Skull: Normal. Negative for fracture or focal lesion.

Sinuses/Orbits: Small mucous retention cyst noted of the right
maxillary sinus. Right lens replacement. Intact orbits and globes.

Other: None.
IMPRESSION: Normal head CT.

Small mucous retention cyst of the right maxillary sinus.

## 2019-12-19 ENCOUNTER — Other Ambulatory Visit: Payer: Self-pay

## 2019-12-30 ENCOUNTER — Ambulatory Visit: Payer: Self-pay

## 2020-01-05 ENCOUNTER — Ambulatory Visit: Payer: Medicaid - Out of State | Attending: Internal Medicine

## 2020-01-05 DIAGNOSIS — Z23 Encounter for immunization: Secondary | ICD-10-CM | POA: Insufficient documentation

## 2020-01-05 NOTE — Progress Notes (Signed)
   Covid-[facility] Vaccination [facility]  Name:  Tammy Morris    MRN: [id number] DOB: 06/14/1951  01/05/2020  Tammy Morris was observed post Covid-19 immunization for 15 minutes without incidence. She was provided with Vaccine Information Sheet and instruction to access the V-Safe system.   Tammy Morris was instructed to call 911 with any severe reactions post vaccine: Marland Kitchen Difficulty breathing  . Swelling of your face and throat  . A fast heartbeat  . A bad rash all over your body  . Dizziness and weakness    Immunizations Administered    Name Date Dose VIS Date Route   Pfizer COVID-19 Vaccine 01/05/2020 11:32 AM 0.3 mL 11/11/2019 Intramuscular   Manufacturer: [employer], [employer]   Lot: [id number]   NDC: [address]-4

## 2020-01-20 ENCOUNTER — Ambulatory Visit: Payer: Self-pay

## 2020-01-30 ENCOUNTER — Ambulatory Visit: Payer: Medicaid - Out of State | Attending: Internal Medicine

## 2020-01-30 DIAGNOSIS — Z23 Encounter for immunization: Secondary | ICD-10-CM | POA: Insufficient documentation

## 2020-01-30 NOTE — Progress Notes (Signed)
   Covid-19 Vaccination Clinic  Name:  Norberta Stobaugh    MRN: [id number] DOB: 16-May-1951  01/30/2020  Ms. Haisley was observed post Covid-19 immunization for 15 minutes without incidence. She was provided with Vaccine Information Sheet and instruction to access the V-Safe system.   Ms. Farias was instructed to call 911 with any severe reactions post vaccine: Marland Kitchen Difficulty breathing  . Swelling of your face and throat  . A fast heartbeat  . A bad rash all over your body  . Dizziness and weakness    Immunizations Administered    Name Date Dose VIS Date Route   Pfizer COVID-19 Vaccine 01/30/2020  1:17 PM 0.3 mL 11/11/2019 Intramuscular   Manufacturer: [employer], [employer]   Lot: [id number]   NDC: [address]-8
